# Patient Record
Sex: Female | Born: 1935 | Race: White | Hispanic: No
Health system: Southern US, Community
[De-identification: ages and names within clinical notes are randomized; demographics above are authoritative.]

---

## 1998-05-26 ENCOUNTER — Ambulatory Visit (HOSPITAL_COMMUNITY): Admission: RE | Admit: 1998-05-26 | Discharge: 1998-05-26 | Payer: Self-pay | Admitting: Ophthalmology

## 1999-05-28 ENCOUNTER — Encounter: Payer: Self-pay | Admitting: Emergency Medicine

## 1999-05-28 ENCOUNTER — Inpatient Hospital Stay (HOSPITAL_COMMUNITY): Admission: EM | Admit: 1999-05-28 | Discharge: 1999-05-30 | Payer: Self-pay | Admitting: Emergency Medicine

## 1999-05-29 ENCOUNTER — Encounter: Payer: Self-pay | Admitting: Pediatrics

## 1999-06-08 ENCOUNTER — Encounter: Payer: Self-pay | Admitting: Emergency Medicine

## 1999-06-08 ENCOUNTER — Inpatient Hospital Stay (HOSPITAL_COMMUNITY): Admission: EM | Admit: 1999-06-08 | Discharge: 1999-06-22 | Payer: Self-pay | Admitting: Emergency Medicine

## 1999-06-09 ENCOUNTER — Encounter: Payer: Self-pay | Admitting: *Deleted

## 1999-06-13 ENCOUNTER — Encounter: Payer: Self-pay | Admitting: *Deleted

## 1999-06-14 ENCOUNTER — Encounter: Payer: Self-pay | Admitting: Gastroenterology

## 1999-06-18 ENCOUNTER — Encounter: Payer: Self-pay | Admitting: Neurology

## 1999-06-20 ENCOUNTER — Encounter: Payer: Self-pay | Admitting: *Deleted

## 1999-06-22 ENCOUNTER — Inpatient Hospital Stay (HOSPITAL_COMMUNITY)
Admission: RE | Admit: 1999-06-22 | Discharge: 1999-07-02 | Payer: Self-pay | Admitting: Physical Medicine & Rehabilitation

## 1999-09-11 ENCOUNTER — Emergency Department (HOSPITAL_COMMUNITY): Admission: EM | Admit: 1999-09-11 | Discharge: 1999-09-11 | Payer: Self-pay | Admitting: Emergency Medicine

## 1999-09-11 ENCOUNTER — Encounter: Payer: Self-pay | Admitting: Emergency Medicine

## 1999-10-21 ENCOUNTER — Emergency Department (HOSPITAL_COMMUNITY): Admission: EM | Admit: 1999-10-21 | Discharge: 1999-10-21 | Payer: Self-pay | Admitting: Emergency Medicine

## 1999-10-21 ENCOUNTER — Encounter: Payer: Self-pay | Admitting: Emergency Medicine

## 1999-12-07 ENCOUNTER — Inpatient Hospital Stay (HOSPITAL_COMMUNITY): Admission: EM | Admit: 1999-12-07 | Discharge: 1999-12-10 | Payer: Self-pay

## 1999-12-08 ENCOUNTER — Encounter: Payer: Self-pay | Admitting: Family Medicine

## 2000-05-31 ENCOUNTER — Encounter: Payer: Self-pay | Admitting: Emergency Medicine

## 2000-05-31 ENCOUNTER — Emergency Department (HOSPITAL_COMMUNITY): Admission: EM | Admit: 2000-05-31 | Discharge: 2000-05-31 | Payer: Self-pay | Admitting: Emergency Medicine

## 2000-06-19 ENCOUNTER — Encounter: Payer: Self-pay | Admitting: Family Medicine

## 2000-06-19 ENCOUNTER — Encounter: Admission: RE | Admit: 2000-06-19 | Discharge: 2000-06-19 | Payer: Self-pay | Admitting: Family Medicine

## 2001-12-31 ENCOUNTER — Inpatient Hospital Stay (HOSPITAL_COMMUNITY): Admission: EM | Admit: 2001-12-31 | Discharge: 2002-01-13 | Payer: Self-pay

## 2002-01-03 ENCOUNTER — Encounter: Payer: Self-pay | Admitting: Internal Medicine

## 2002-01-06 ENCOUNTER — Encounter: Payer: Self-pay | Admitting: Internal Medicine

## 2002-01-08 ENCOUNTER — Encounter: Payer: Self-pay | Admitting: Internal Medicine

## 2002-01-10 ENCOUNTER — Encounter: Payer: Self-pay | Admitting: Internal Medicine

## 2002-03-03 ENCOUNTER — Inpatient Hospital Stay (HOSPITAL_COMMUNITY): Admission: EM | Admit: 2002-03-03 | Discharge: 2002-03-11 | Payer: Self-pay | Admitting: Thoracic Surgery

## 2002-03-04 ENCOUNTER — Encounter: Payer: Self-pay | Admitting: Thoracic Surgery

## 2002-03-05 ENCOUNTER — Encounter: Payer: Self-pay | Admitting: Thoracic Surgery

## 2002-03-06 ENCOUNTER — Encounter: Payer: Self-pay | Admitting: Thoracic Surgery

## 2002-03-07 ENCOUNTER — Encounter: Payer: Self-pay | Admitting: Thoracic Surgery

## 2002-03-08 ENCOUNTER — Encounter: Payer: Self-pay | Admitting: Orthopedic Surgery

## 2002-03-08 ENCOUNTER — Encounter: Payer: Self-pay | Admitting: Thoracic Surgery

## 2002-03-10 ENCOUNTER — Encounter: Payer: Self-pay | Admitting: Thoracic Surgery

## 2002-03-13 ENCOUNTER — Inpatient Hospital Stay (HOSPITAL_COMMUNITY): Admission: AD | Admit: 2002-03-13 | Discharge: 2002-03-30 | Payer: Self-pay | Admitting: Surgery

## 2002-03-14 ENCOUNTER — Encounter: Payer: Self-pay | Admitting: Surgery

## 2002-03-15 ENCOUNTER — Encounter: Payer: Self-pay | Admitting: Neurology

## 2002-03-17 ENCOUNTER — Encounter: Payer: Self-pay | Admitting: Surgery

## 2002-03-17 ENCOUNTER — Encounter: Payer: Self-pay | Admitting: Thoracic Surgery

## 2002-03-19 ENCOUNTER — Encounter: Payer: Self-pay | Admitting: Thoracic Surgery

## 2002-03-22 ENCOUNTER — Encounter: Payer: Self-pay | Admitting: Thoracic Surgery

## 2002-03-25 ENCOUNTER — Encounter: Payer: Self-pay | Admitting: Thoracic Surgery

## 2002-03-29 ENCOUNTER — Encounter: Payer: Self-pay | Admitting: Thoracic Surgery

## 2002-05-13 ENCOUNTER — Encounter: Admission: RE | Admit: 2002-05-13 | Discharge: 2002-05-13 | Payer: Self-pay | Admitting: Thoracic Surgery

## 2002-05-13 ENCOUNTER — Encounter: Payer: Self-pay | Admitting: Thoracic Surgery

## 2004-03-12 ENCOUNTER — Inpatient Hospital Stay (HOSPITAL_COMMUNITY): Admission: EM | Admit: 2004-03-12 | Discharge: 2004-03-15 | Payer: Self-pay | Admitting: Emergency Medicine

## 2006-03-07 IMAGING — CR DG CHEST 1V PORT
1 series · 1 of 1 positions shown · non-contrast
Comparison: none

CLINICAL DATA: Urosepsis.  
 PORTABLE CHEST
 Semierect AP portable shows prominent heart but no congestive heart failure or active disease.  Osseous structures intact with degenerative changes of the shoulders and osteopenia.  
 IMPRESSION
 Cardiomegaly - no active disease.

[view not recorded]
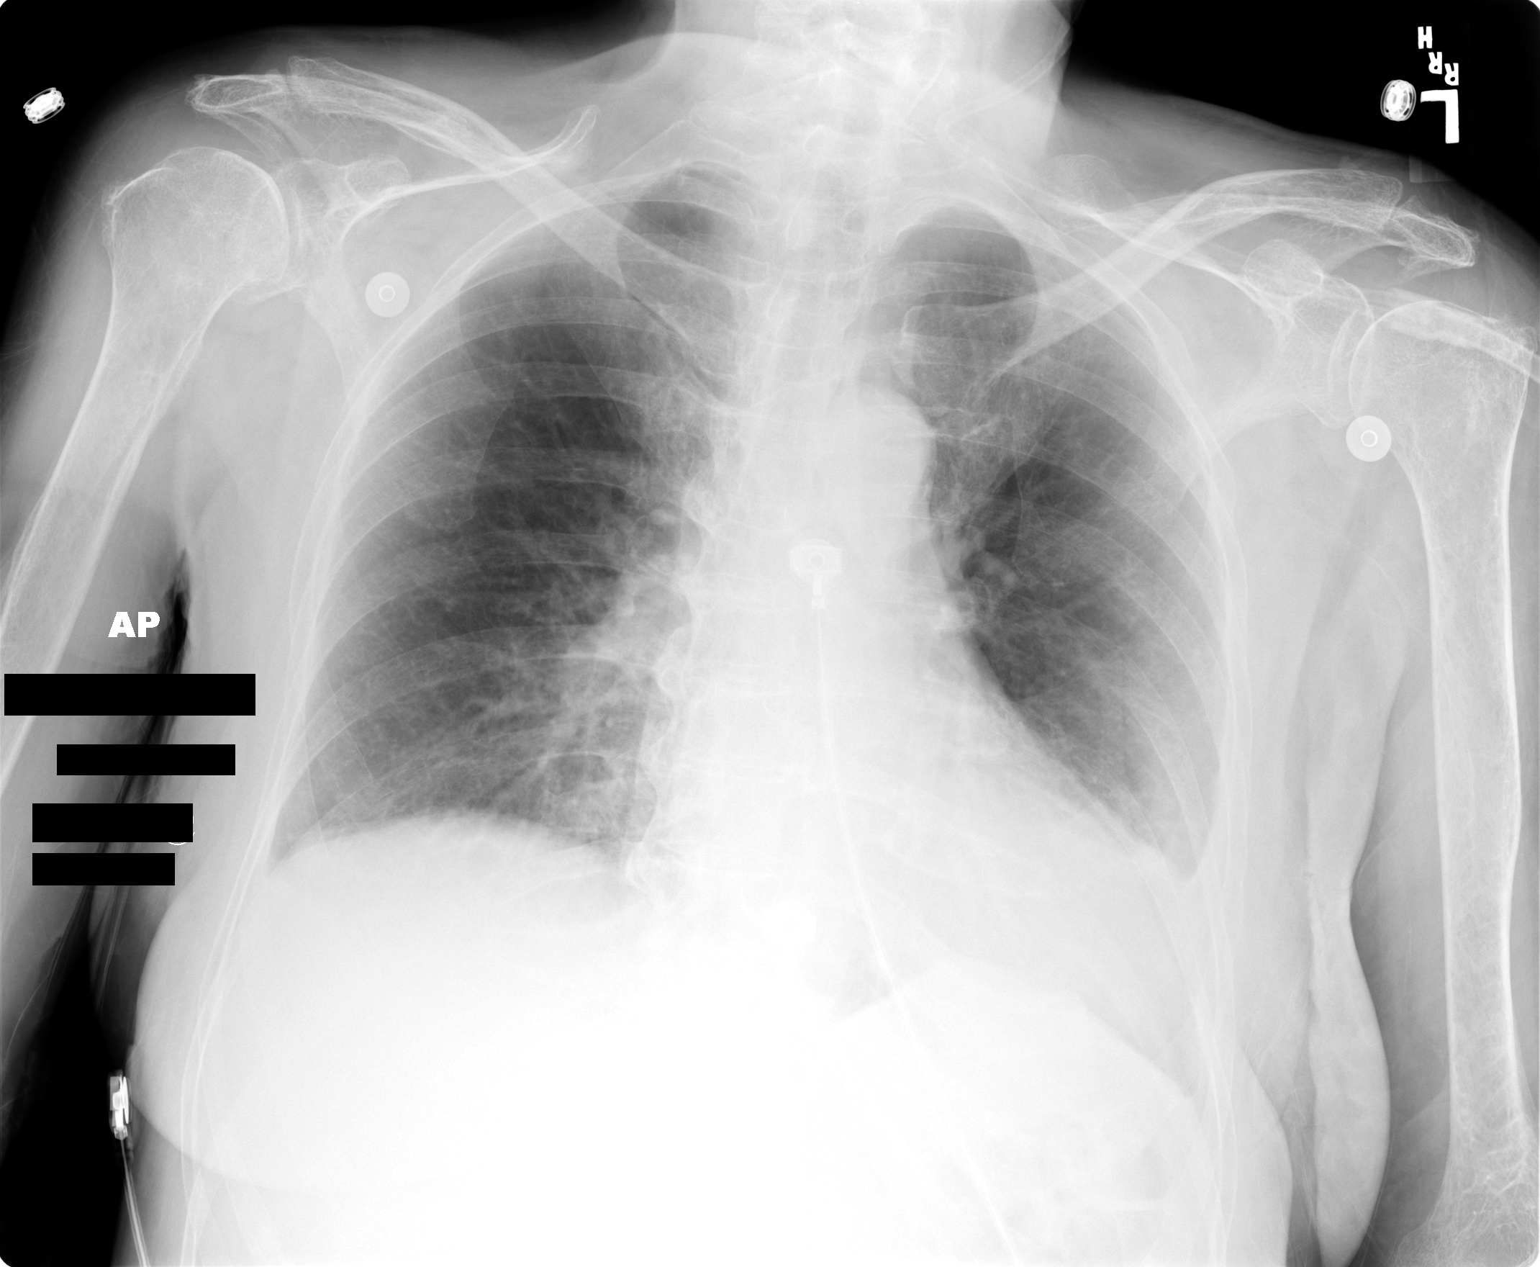

[1 of 1 positions shown; findings below may reference images not displayed]

## 2006-10-20 ENCOUNTER — Inpatient Hospital Stay (HOSPITAL_COMMUNITY): Admission: EM | Admit: 2006-10-20 | Discharge: 2006-10-24 | Payer: Self-pay | Admitting: Emergency Medicine

## 2008-10-16 IMAGING — CR DG CHEST 1V
1 series · 1 of 1 positions shown · non-contrast
Comparison: none

HISTORY: CHF

CHEST ONE VIEW:
Comparison 10/20/2006
Cardiac enlargement.
Minimal vascular congestion.
Mildly tortuous aorta.
Improved interstitial edema.
Very small bibasilar effusions and atelectasis.
Bony demineralization with spur formation thoracic spine.

[view not recorded]
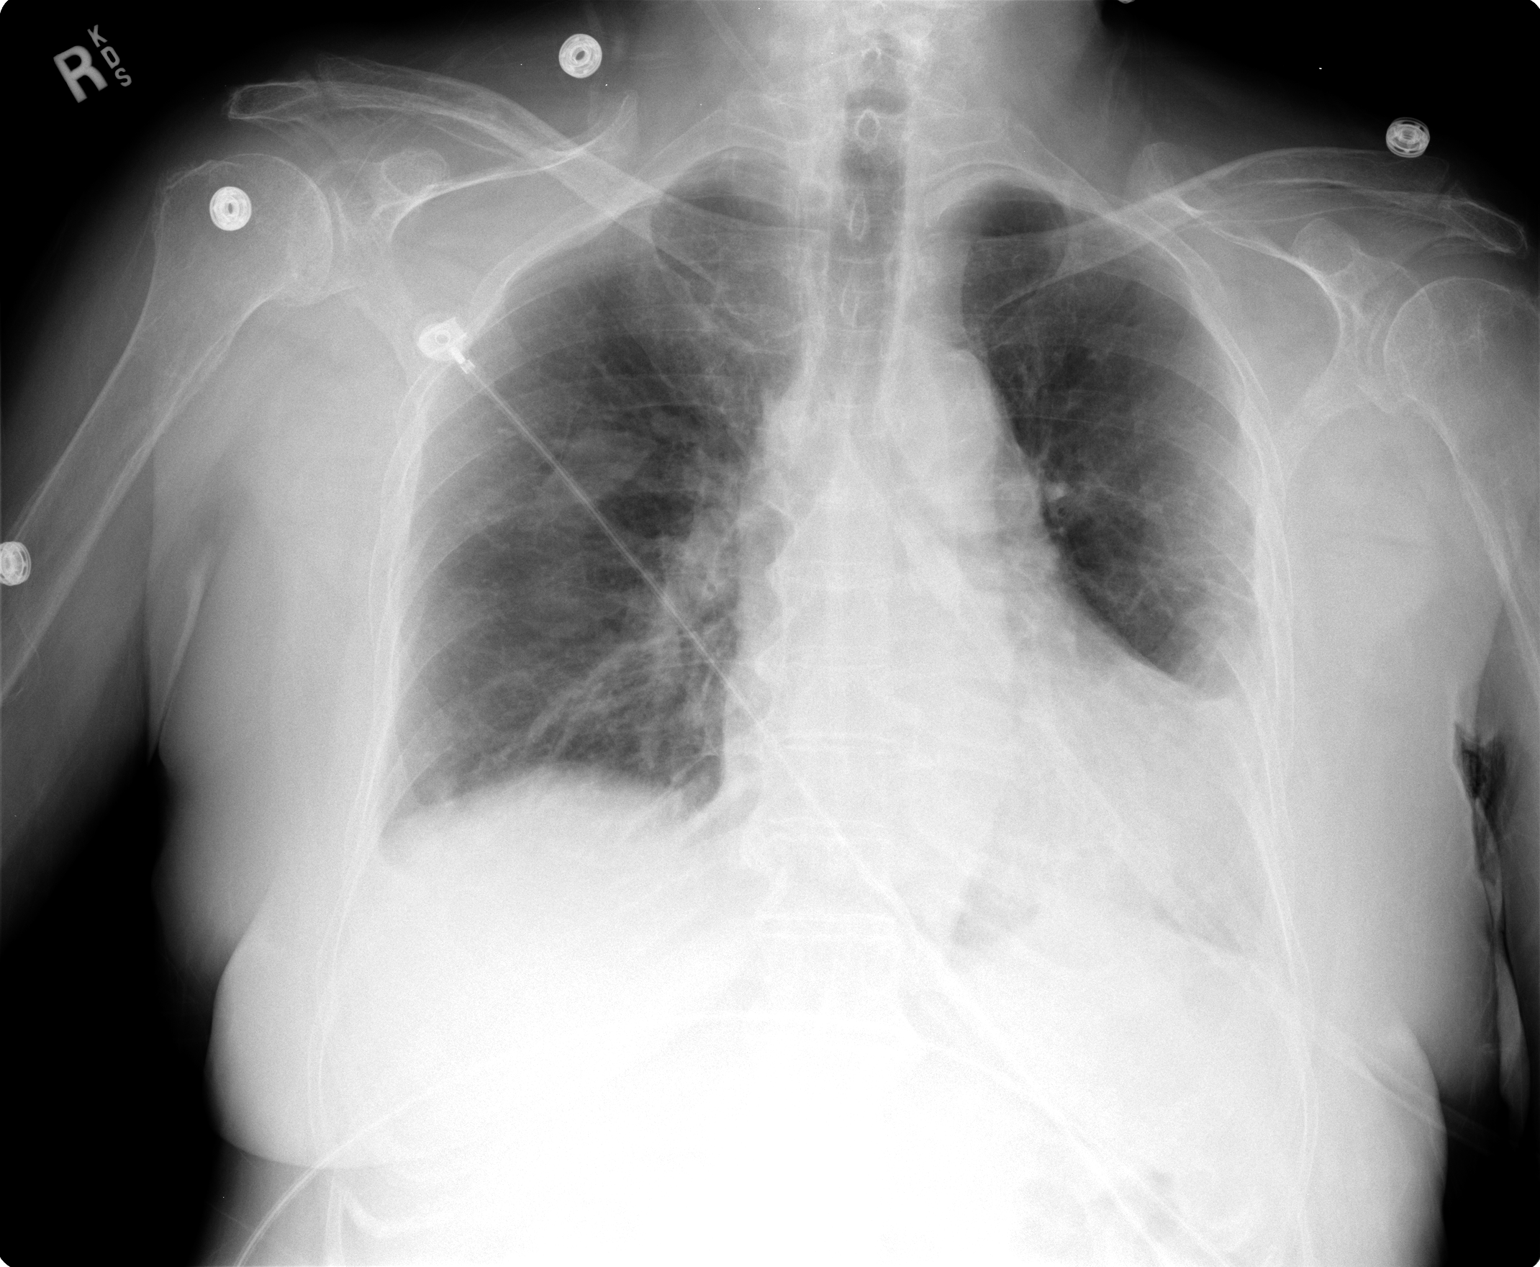

[1 of 1 positions shown; findings below may reference images not displayed]

IMPRESSION: Improving CHF.
Improving bibasilar atelectasis and pleural effusions.

## 2017-05-05 ENCOUNTER — Telehealth: Payer: Self-pay | Admitting: *Deleted

## 2017-06-06 NOTE — Telephone Encounter (Signed)
Opened in error
